# Patient Record
Sex: Female | Born: 1990 | Race: White | Hispanic: Yes | Marital: Single | Smoking: Former smoker
Health system: Southern US, Community
[De-identification: ages and names within clinical notes are randomized; demographics above are authoritative.]

## PROBLEM LIST (undated history)

## (undated) DIAGNOSIS — J45909 Unspecified asthma, uncomplicated: Secondary | ICD-10-CM

---
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
## 2009-05-17 ENCOUNTER — Emergency Department: Payer: Self-pay | Admitting: Internal Medicine

## 2009-05-24 ENCOUNTER — Emergency Department: Payer: Self-pay | Admitting: Emergency Medicine

## 2009-06-07 ENCOUNTER — Emergency Department: Payer: Self-pay | Admitting: Emergency Medicine

## 2009-06-09 ENCOUNTER — Emergency Department: Payer: Self-pay | Admitting: Emergency Medicine

## 2009-06-15 ENCOUNTER — Emergency Department: Payer: Self-pay | Admitting: Emergency Medicine

## 2010-06-04 ENCOUNTER — Emergency Department: Payer: Self-pay | Admitting: Unknown Physician Specialty

## 2011-05-05 ENCOUNTER — Emergency Department: Payer: Self-pay | Admitting: Emergency Medicine

## 2011-07-13 ENCOUNTER — Emergency Department: Payer: Self-pay | Admitting: Emergency Medicine

## 2016-10-20 ENCOUNTER — Encounter: Payer: Self-pay | Admitting: Emergency Medicine

## 2016-10-20 ENCOUNTER — Emergency Department
Admission: EM | Admit: 2016-10-20 | Discharge: 2016-10-20 | Disposition: A | Discharge status: Home / Self Care | Payer: Self-pay | Attending: Emergency Medicine | Admitting: Emergency Medicine

## 2016-10-20 DIAGNOSIS — N390 Urinary tract infection, site not specified: Secondary | ICD-10-CM | POA: Insufficient documentation

## 2016-10-20 DIAGNOSIS — F17200 Nicotine dependence, unspecified, uncomplicated: Secondary | ICD-10-CM | POA: Insufficient documentation

## 2016-10-20 DIAGNOSIS — J45909 Unspecified asthma, uncomplicated: Secondary | ICD-10-CM | POA: Insufficient documentation

## 2016-10-20 DIAGNOSIS — R42 Dizziness and giddiness: Secondary | ICD-10-CM | POA: Insufficient documentation

## 2016-10-20 HISTORY — DX: Unspecified asthma, uncomplicated: J45.909

## 2016-10-20 LAB — BASIC METABOLIC PANEL
ANION GAP: 9 (ref 5–15)
BUN: 13 mg/dL (ref 6–20)
CHLORIDE: 108 mmol/L (ref 101–111)
CO2: 22 mmol/L (ref 22–32)
Calcium: 8.9 mg/dL (ref 8.9–10.3)
Creatinine, Ser: 0.75 mg/dL (ref 0.44–1.00)
GFR calc Af Amer: >60 mL/min (ref >60)
GLUCOSE: 114 mg/dL — AB (ref 65–99)
POTASSIUM: 3.8 mmol/L (ref 3.5–5.1)
Sodium: 139 mmol/L (ref 135–145)

## 2016-10-20 LAB — URINALYSIS, COMPLETE (UACMP) WITH MICROSCOPIC
BILIRUBIN URINE: NEGATIVE
GLUCOSE, UA: NEGATIVE mg/dL
Ketones, ur: NEGATIVE mg/dL
NITRITE: NEGATIVE
PH: 5 (ref 5.0–8.0)
Protein, ur: 30 mg/dL — AB
SPECIFIC GRAVITY, URINE: 1.025 (ref 1.005–1.030)

## 2016-10-20 LAB — CBC
HCT: 41.8 % (ref 35.0–47.0)
Hemoglobin: 14.6 g/dL (ref 12.0–16.0)
MCH: 29.1 pg (ref 26.0–34.0)
MCHC: 35 g/dL (ref 32.0–36.0)
MCV: 83.2 fL (ref 80.0–100.0)
PLATELETS: 597 10*3/uL — AB (ref 150–440)
RBC: 5.03 MIL/uL (ref 3.80–5.20)
RDW: 13.7 % (ref 11.5–14.5)
WBC: 13.8 10*3/uL — ABNORMAL HIGH (ref 3.6–11.0)

## 2016-10-20 LAB — POCT PREGNANCY, URINE: Preg Test, Ur: NEGATIVE

## 2016-10-20 MED ORDER — CEPHALEXIN 500 MG PO CAPS: 500.0000 mg | ORAL_CAPSULE | Freq: Two times a day (BID) | ORAL | 0 refills | Status: AC

## 2016-10-20 MED ORDER — CEPHALEXIN 500 MG PO CAPS
500.0000 mg | ORAL_CAPSULE | Freq: Once | ORAL | Status: AC
  Administered 2016-10-20: 500 mg via ORAL
  Filled 2016-10-20: qty 1

## 2016-10-20 NOTE — ED Provider Notes (Signed)
[facility] Emergency Department Provider Note  Time seen: 9:10 PM  I have reviewed the triage vital signs and the nursing notes.   HISTORY  Chief Complaint Dizziness    HPI Grace Hanson is a 26 y.o. female with a past medical history of asthma presents to the emergency department for dizziness. According to the patient she awoke this morning she got up and felt somewhat dizzy. States she thought she might adjusted up to quickly however this afternoon she states she got up and once again felt dizzy so she came to the emergency department for evaluation. Patient denies any focal weakness or numbness. Denies nausea, vomiting, diarrhea. Denies chest pain or abdominal pain. Denies headache. Patient states currently she feels normal. No complaints at this time.  Past Medical History:  Diagnosis Date  . Asthma     There are no active problems to display for this patient.   History reviewed. No pertinent surgical history.  Prior to Admission medications   Not on File    No Known Allergies  No family history on file.  Social History Social History  Substance Use Topics  . Smoking status: Current Every Day Smoker    Packs/day: 0.50  . Smokeless tobacco: Never Used  . Alcohol use No    Review of Systems Constitutional: Negative for fever. Cardiovascular: Negative for chest pain. Respiratory: Negative for shortness of breath. Gastrointestinal: Negative for abdominal pain, vomiting and diarrhea. Genitourinary: Negative for dysuria.Negative for vaginal bleeding or discharge. Neurological: Negative for headaches, focal weakness or numbness. 10-point ROS otherwise negative.  ____________________________________________   PHYSICAL EXAM:  VITAL SIGNS: ED Triage Vitals  Enc Vitals Group     BP 10/20/16 2004 125/65     Pulse Rate 10/20/16 2004 75     Resp 10/20/16 2004 16     Temp 10/20/16 2004 98 F (36.7 C)     Temp Source 10/20/16 2004  Oral     SpO2 10/20/16 2004 98 %     Weight 10/20/16 2005 180 lb (81.6 kg)     Height 10/20/16 2005 5\' 3"  (1.6 m)     Head Circumference --      Peak Flow --      Pain Score 10/20/16 2006 0     Pain Loc --      Pain Edu? --      Excl. in GC? --     Constitutional: Alert and oriented. Well appearing and in no distress. Eyes: Normal exam ENT   Head: Normocephalic and atraumatic.   Mouth/Throat: Mucous membranes are moist. Cardiovascular: Normal rate, regular rhythm. No murmur Respiratory: Normal respiratory effort without tachypnea nor retractions. Breath sounds are clear Gastrointestinal: Soft and nontender. No distention. Musculoskeletal: Nontender with normal range of motion in all extremities. Neurologic:  Normal speech and language. No gross focal neurologic deficits Skin:  Skin is warm, dry and intact.  Psychiatric: Mood and affect are normal.   ____________________________________________    EKG  EKG reviewed and interpreted, so shows normal sinus rhythm at 74 bpm. Narrow QRS, normal axis, normal intervals, no concerning ST changes.  ____________________________________________    INITIAL IMPRESSION / ASSESSMENT AND PLAN / ED COURSE  Pertinent labs & imaging results that were available during my care of the patient were reviewed by me and considered in my medical decision making (see chart for details).  Patient presents to the emergency department with dizziness starting this morning. Patient's labs show an elevated white blood cell count  of 13,000 with thrombocytosis. Patient's urinalysis shows too numerous to count white blood cells with a large amount of leukocytes. Patient denies vaginal bleeding or discharge. Highly suspect urinary tract infection causing the patient's dizziness. Overall the patient has a very normal physical examination, denies any dizziness or any other complaint at this time. We will send a urine culture, prescribe  antibiotic.  ____________________________________________   FINAL CLINICAL IMPRESSION(S) / ED DIAGNOSES  Dizziness Urinary tract infection    Minna Antis, MD 10/20/16 2113

## 2016-10-20 NOTE — ED Triage Notes (Signed)
Pt states that she felt dizzy this morning x2. Pt states that she "feels like she has no pressure in her nose" and she has asthma. Pt denies LOC and is ambulatory to triage and steady on her feet.

## 2016-10-20 NOTE — Discharge Instructions (Signed)
Please take her antibiotic as prescribed. Please drink plenty of fluids. Return to the emergency department for any worsening dizziness, or any other symptom personally concerning to yourself.

## 2016-10-22 LAB — URINE CULTURE

## 2018-09-12 ENCOUNTER — Emergency Department: Payer: Self-pay

## 2018-09-12 ENCOUNTER — Encounter: Payer: Self-pay | Admitting: Emergency Medicine

## 2018-09-12 ENCOUNTER — Emergency Department
Admission: EM | Admit: 2018-09-12 | Discharge: 2018-09-12 | Disposition: A | Discharge status: Home / Self Care | Payer: Self-pay | Attending: Emergency Medicine | Admitting: Emergency Medicine

## 2018-09-12 ENCOUNTER — Other Ambulatory Visit: Payer: Self-pay

## 2018-09-12 DIAGNOSIS — F17200 Nicotine dependence, unspecified, uncomplicated: Secondary | ICD-10-CM | POA: Insufficient documentation

## 2018-09-12 DIAGNOSIS — J45909 Unspecified asthma, uncomplicated: Secondary | ICD-10-CM | POA: Insufficient documentation

## 2018-09-12 DIAGNOSIS — M5410 Radiculopathy, site unspecified: Secondary | ICD-10-CM | POA: Insufficient documentation

## 2018-09-12 LAB — POCT PREGNANCY, URINE: PREG TEST UR: NEGATIVE

## 2018-09-12 MED ORDER — DEXAMETHASONE SODIUM PHOSPHATE 10 MG/ML IJ SOLN
10.0000 mg | Freq: Once | INTRAMUSCULAR | Status: AC
  Administered 2018-09-12: 10 mg via INTRAMUSCULAR
  Filled 2018-09-12: qty 1

## 2018-09-12 MED ORDER — PREDNISONE 50 MG PO TABS: ORAL_TABLET | ORAL | 0 refills | Status: DC

## 2018-09-12 NOTE — ED Provider Notes (Signed)
[facility] Emergency Department Provider Note  ____________________________________________  Time seen: Approximately 9:49 PM  I have reviewed the triage vital signs and the nursing notes.   HISTORY  Chief Complaint Leg Pain    HPI Grace Hanson is a 27 y.o. female presents to the emergency department with 6 out of 10 left lower leg pain and left lateral hip pain after patient reports that she fell approximately 3 weeks ago in the shower.  Patient reports that she did not have pain initially but several days after the injury she started to have discomfort while sitting in the car.  Patient describes pain as "like numb and tingling".  Pain does seem to radiate along calf and lateral hip.  Patient denies bowel or bladder incontinence or saddle anesthesia.  Patient reports that pain is improved with ambulation and worsened with a sitting position.  Patient denies fever.  She has been taking ibuprofen at home.  She denies contraceptive use, recent travel, prolonged immobility, shortness of breath, pleuritic chest pain or prior history of DVT or PE.   Past Medical History:  Diagnosis Date  . Asthma     There are no active problems to display for this patient.   History reviewed. No pertinent surgical history.  Prior to Admission medications   Medication Sig Start Date End Date Taking? Authorizing Provider  predniSONE (DELTASONE) 50 MG tablet Take 6 tablets the first day, take 5 tablets the second day, take 4 tablets the third day, take 3 tablets the fourth day, take 2 tablets the fifth day, take 1 tablet the sixth day. 09/12/18   Orvil Feil, PA-C    Allergies Patient has no known allergies.  No family history on file.  Social History Social History   Tobacco Use  . Smoking status: Current Every Day Smoker    Packs/day: 0.50  . Smokeless tobacco: Never Used  Substance Use Topics  . Alcohol use: No  . Drug use: No     Review of Systems   Constitutional: No fever/chills Eyes: No visual changes. No discharge ENT: No upper respiratory complaints. Cardiovascular: no chest pain. Respiratory: no cough. No SOB. Gastrointestinal: No abdominal pain.  No nausea, no vomiting.  No diarrhea.  No constipation. Musculoskeletal: Patient has left lower leg pain.  Skin: Negative for rash, abrasions, lacerations, ecchymosis. Neurological: Negative for headaches, focal weakness or numbness. ______________________________________   PHYSICAL EXAM:  VITAL SIGNS: ED Triage Vitals  Enc Vitals Group     BP 09/12/18 1909 130/80     Pulse Rate 09/12/18 1909 89     Resp 09/12/18 1909 18     Temp 09/12/18 1909 98.5 F (36.9 C)     Temp Source 09/12/18 1909 Oral     SpO2 09/12/18 1909 99 %     Weight 09/12/18 1910 180 lb (81.6 kg)     Height 09/12/18 1910 5\' 3"  (1.6 m)     Head Circumference --      Peak Flow --      Pain Score 09/12/18 1909 8     Pain Loc --      Pain Edu? --      Excl. in GC? --      Constitutional: Alert and oriented. Well appearing and in no acute distress. Eyes: Conjunctivae are normal. PERRL. EOMI. Head: Atraumatic. Cardiovascular: Normal rate, regular rhythm. Normal S1 and S2.  Good peripheral circulation. Respiratory: Normal respiratory effort without tachypnea or retractions. Lungs CTAB. Good air entry  to the bases with no decreased or absent breath sounds. Musculoskeletal: Full range of motion to all extremities. No gross deformities appreciated. Neurologic:  Normal speech and language. No gross focal neurologic deficits are appreciated.  Skin: No erythema of the skin overlying the left calf. Psychiatric: Mood and affect are normal. Speech and behavior are normal. Patient exhibits appropriate insight and judgement.   ____________________________________________   LABS (all labs ordered are listed, but only abnormal results are displayed)  Labs Reviewed  POCT PREGNANCY, URINE    ____________________________________________  EKG   ____________________________________________  RADIOLOGY I personally viewed and evaluated these images as part of my medical decision making, as well as reviewing the written report by the radiologist.  Dg Lumbar Spine 2-3 Views  Result Date: 09/12/2018 CLINICAL DATA:  Fall 3 weeks ago.  Radicular pain in left leg EXAM: LUMBAR SPINE - 2-3 VIEW COMPARISON:  None. FINDINGS: Transitional anatomy at the lumbosacral junction. Normal alignment. No fracture. SI joints are symmetric and unremarkable. IMPRESSION: Negative. Electronically Signed   By: Charlett NoseKevin  Dover M.D.   On: 09/12/2018 21:00   Dg Tibia/fibula Left  Result Date: 09/12/2018 CLINICAL DATA:  Fall 3 weeks ago after a slip in the shower. Subsequent leg pain. EXAM: LEFT TIBIA AND FIBULA - 2 VIEW COMPARISON:  None. FINDINGS: Achilles calcaneal spur. Mild spurring along the proximal dorsal margin of the navicular. No fracture or acute bony abnormality is identified. IMPRESSION: 1. No acute findings. 2. Minimal degenerative spurring in the hindfoot/midfoot. Electronically Signed   By: Gaylyn RongWalter  Liebkemann M.D.   On: 09/12/2018 21:16    ____________________________________________    PROCEDURES  Procedure(s) performed:    Procedures    Medications  dexamethasone (DECADRON) injection 10 mg (10 mg Intramuscular Given 09/12/18 2134)     ____________________________________________   INITIAL IMPRESSION / ASSESSMENT AND PLAN / ED COURSE  Pertinent labs & imaging results that were available during my care of the patient were reviewed by me and considered in my medical decision making (see chart for details).  Review of the  [place] was performed in accordance of the [place] prior to dispensing any controlled drugs.    Assessment and plan Radicular pain Patient presents to the emergency department with numbness and tingling of the left lower extremity from the left hip to the  left calf.  History and physical exam findings are consistent with referred pain from the lumbar spine.  X-ray examination of the lumbar spine and the left tibia/fibula revealed no acute bony abnormality.  Patient was given an injection of Decadron in the emergency department and was discharged with prednisone.  Vital signs are reassuring prior to discharge.  All patient questions were answered.   ____________________________________________  FINAL CLINICAL IMPRESSION(S) / ED DIAGNOSES  Final diagnoses:  Radicular pain      NEW MEDICATIONS STARTED DURING THIS VISIT:  ED Discharge Orders         Ordered    predniSONE (DELTASONE) 50 MG tablet     09/12/18 2127              This chart was dictated using voice recognition software/Dragon. Despite best efforts to proofread, errors can occur which can change the meaning. Any change was purely unintentional.    Gasper LloydWoods, Kaelee Pfeffer M, PA-C 09/12/18 2157    Myrna BlazerSchaevitz, David Matthew, MD 09/13/18 [phone]

## 2018-09-12 NOTE — ED Triage Notes (Signed)
Pt arrives ambulatory to triage with c/o fall x 3 weeks ago and subsequent left leg pain at this time. Pt is in NAD.

## 2019-07-06 ENCOUNTER — Other Ambulatory Visit: Payer: Self-pay

## 2019-07-06 ENCOUNTER — Emergency Department
Admission: EM | Admit: 2019-07-06 | Discharge: 2019-07-06 | Disposition: A | Discharge status: Home / Self Care | Payer: Self-pay | Attending: Student in an Organized Health Care Education/Training Program | Admitting: Student in an Organized Health Care Education/Training Program

## 2019-07-06 ENCOUNTER — Emergency Department: Payer: Self-pay

## 2019-07-06 DIAGNOSIS — Z87891 Personal history of nicotine dependence: Secondary | ICD-10-CM | POA: Insufficient documentation

## 2019-07-06 DIAGNOSIS — J45909 Unspecified asthma, uncomplicated: Secondary | ICD-10-CM | POA: Insufficient documentation

## 2019-07-06 DIAGNOSIS — R1030 Lower abdominal pain, unspecified: Secondary | ICD-10-CM

## 2019-07-06 DIAGNOSIS — N760 Acute vaginitis: Secondary | ICD-10-CM | POA: Insufficient documentation

## 2019-07-06 DIAGNOSIS — B9689 Other specified bacterial agents as the cause of diseases classified elsewhere: Secondary | ICD-10-CM | POA: Insufficient documentation

## 2019-07-06 LAB — COMPREHENSIVE METABOLIC PANEL
ALT: 40 U/L (ref 0–44)
AST: 30 U/L (ref 15–41)
Albumin: 4.1 g/dL (ref 3.5–5.0)
Alkaline Phosphatase: 60 U/L (ref 38–126)
Anion gap: 10 (ref 5–15)
BUN: 10 mg/dL (ref 6–20)
CO2: 22 mmol/L (ref 22–32)
Calcium: 9.3 mg/dL (ref 8.9–10.3)
Chloride: 104 mmol/L (ref 98–111)
Creatinine, Ser: 0.51 mg/dL (ref 0.44–1.00)
GFR calc Af Amer: >60 mL/min (ref >60)
GFR calc non Af Amer: >60 mL/min (ref >60)
Glucose, Bld: 97 mg/dL (ref 70–99)
Potassium: 3.9 mmol/L (ref 3.5–5.1)
Sodium: 136 mmol/L (ref 135–145)
Total Bilirubin: 0.7 mg/dL (ref 0.3–1.2)
Total Protein: 8 g/dL (ref 6.5–8.1)

## 2019-07-06 LAB — URINALYSIS, COMPLETE (UACMP) WITH MICROSCOPIC
Bilirubin Urine: NEGATIVE
Glucose, UA: NEGATIVE mg/dL
Ketones, ur: NEGATIVE mg/dL
Nitrite: NEGATIVE
Protein, ur: NEGATIVE mg/dL
Specific Gravity, Urine: 1.025 (ref 1.005–1.030)
pH: 5 (ref 5.0–8.0)

## 2019-07-06 LAB — CBC
HCT: 42.3 % (ref 36.0–46.0)
Hemoglobin: 14.5 g/dL (ref 12.0–15.0)
MCH: 28.6 pg (ref 26.0–34.0)
MCHC: 34.3 g/dL (ref 30.0–36.0)
MCV: 83.4 fL (ref 80.0–100.0)
Platelets: 565 10*3/uL — ABNORMAL HIGH (ref 150–400)
RBC: 5.07 MIL/uL (ref 3.87–5.11)
RDW: 13.3 % (ref 11.5–15.5)
WBC: 16.2 10*3/uL — ABNORMAL HIGH (ref 4.0–10.5)
nRBC: 0 % (ref 0.0–0.2)

## 2019-07-06 LAB — WET PREP, GENITAL
Sperm: NONE SEEN
Trich, Wet Prep: NONE SEEN
Yeast Wet Prep HPF POC: NONE SEEN

## 2019-07-06 LAB — POCT PREGNANCY, URINE: Preg Test, Ur: NEGATIVE

## 2019-07-06 LAB — LIPASE, BLOOD: Lipase: 28 U/L (ref 11–51)

## 2019-07-06 MED ORDER — SODIUM CHLORIDE 0.9% FLUSH: 3.0000 mL | Freq: Once | INTRAVENOUS | Status: DC

## 2019-07-06 MED ORDER — IOHEXOL 300 MG/ML  SOLN
100.0000 mL | Freq: Once | INTRAMUSCULAR | Status: AC | PRN
  Administered 2019-07-06: 100 mL via INTRAVENOUS
  Filled 2019-07-06: qty 100

## 2019-07-06 MED ORDER — METRONIDAZOLE 500 MG PO TABS: 500.0000 mg | ORAL_TABLET | Freq: Three times a day (TID) | ORAL | 0 refills | Status: AC

## 2019-07-06 MED ORDER — ONDANSETRON HCL 4 MG PO TABS: 4.0000 mg | ORAL_TABLET | Freq: Every day | ORAL | 0 refills | Status: DC | PRN

## 2019-07-06 MED ORDER — METRONIDAZOLE 500 MG PO TABS
500.0000 mg | ORAL_TABLET | Freq: Once | ORAL | Status: AC
  Administered 2019-07-06: 500 mg via ORAL
  Filled 2019-07-06: qty 1

## 2019-07-06 NOTE — ED Provider Notes (Signed)
[facility] Emergency Department Provider Note    First MD Initiated Contact with Patient 07/06/19 1855     (approximate)  I have reviewed the triage vital signs and the nursing notes.   HISTORY  Chief Complaint Abdominal Pain    HPI Grace Hanson is a 28 y.o. female presents the ER for evaluation of suprapubic and low back pain started last night is progressively worsened.  Denies any diarrhea or dysuria.  Has had some chills but no measured fevers.  No nausea or vomiting.  No previous surgeries.  States that she has had some vaginal spotting.  Denies any history of STI.  Has not taken anything for pain.  States is mild to moderate.    Past Medical History:  Diagnosis Date  . Asthma    No family history on file. History reviewed. No pertinent surgical history. There are no active problems to display for this patient.     Prior to Admission medications   Medication Sig Start Date End Date Taking? Authorizing Provider  metroNIDAZOLE (FLAGYL) 500 MG tablet Take 1 tablet (500 mg total) by mouth 3 (three) times daily for 7 days. 07/06/19 07/13/19  Merlyn Lot, MD  ondansetron (ZOFRAN) 4 MG tablet Take 1 tablet (4 mg total) by mouth daily as needed. 07/06/19 07/05/20  Merlyn Lot, MD  predniSONE (DELTASONE) 50 MG tablet Take 6 tablets the first day, take 5 tablets the second day, take 4 tablets the third day, take 3 tablets the fourth day, take 2 tablets the fifth day, take 1 tablet the sixth day. 09/12/18   Lannie Fields, PA-C    Allergies Patient has no known allergies.    Social History Social History   Tobacco Use  . Smoking status: Former Smoker    Packs/day: 0.50  . Smokeless tobacco: Never Used  Substance Use Topics  . Alcohol use: No  . Drug use: No    Review of Systems Patient denies headaches, rhinorrhea, blurry vision, numbness, shortness of breath, chest pain, edema, cough, abdominal pain, nausea, vomiting,  diarrhea, dysuria, fevers, rashes or hallucinations unless otherwise stated above in HPI. ____________________________________________   PHYSICAL EXAM:  VITAL SIGNS: Vitals:   07/06/19 1823  BP: 127/73  Pulse: 81  Resp: 16  Temp: 97.9 F (36.6 C)  SpO2: 97%    Constitutional: Alert and oriented.  Eyes: Conjunctivae are normal.  Head: Atraumatic. Nose: No congestion/rhinnorhea. Mouth/Throat: Mucous membranes are moist.   Neck: No stridor. Painless ROM.  Cardiovascular: Normal rate, regular rhythm. Grossly normal heart sounds.  Good peripheral circulation. Respiratory: Normal respiratory effort.  No retractions. Lungs CTAB. Gastrointestinal: Soft with mild ttp in RLE and suprapubic region. No distention. No abdominal bruits. No CVA tenderness. Genitourinary: Normal-appearing vaginal mucosa without any evidence of hemorrhage or mass. Musculoskeletal: No lower extremity tenderness nor edema.  No joint effusions. Neurologic:  Normal speech and language. No gross focal neurologic deficits are appreciated. No facial droop Skin:  Skin is warm, dry and intact. No rash noted. Psychiatric: Mood and affect are normal. Speech and behavior are normal.  ____________________________________________   LABS (all labs ordered are listed, but only abnormal results are displayed)  Results for orders placed or performed during the hospital encounter of 07/06/19 (from the past 24 hour(s))  Lipase, blood     Status: None   Collection Time: 07/06/19  6:27 PM  Result Value Ref Range   Lipase 28 11 - 51 U/L  Comprehensive metabolic panel  Status: None   Collection Time: 07/06/19  6:27 PM  Result Value Ref Range   Sodium 136 135 - 145 mmol/L   Potassium 3.9 3.5 - 5.1 mmol/L   Chloride 104 98 - 111 mmol/L   CO2 22 22 - 32 mmol/L   Glucose, Bld 97 70 - 99 mg/dL   BUN 10 6 - 20 mg/dL   Creatinine, Ser 4.090.51 0.44 - 1.00 mg/dL   Calcium 9.3 8.9 - 81.110.3 mg/dL   Total Protein 8.0 6.5 - 8.1 g/dL    Albumin 4.1 3.5 - 5.0 g/dL   AST 30 15 - 41 U/L   ALT 40 0 - 44 U/L   Alkaline Phosphatase 60 38 - 126 U/L   Total Bilirubin 0.7 0.3 - 1.2 mg/dL   GFR calc non Af Amer >60 >60 mL/min   GFR calc Af Amer >60 >60 mL/min   Anion gap 10 5 - 15  CBC     Status: Abnormal   Collection Time: 07/06/19  6:27 PM  Result Value Ref Range   WBC 16.2 (H) 4.0 - 10.5 K/uL   RBC 5.07 3.87 - 5.11 MIL/uL   Hemoglobin 14.5 12.0 - 15.0 g/dL   HCT 91.442.3 78.236.0 - 95.646.0 %   MCV 83.4 80.0 - 100.0 fL   MCH 28.6 26.0 - 34.0 pg   MCHC 34.3 30.0 - 36.0 g/dL   RDW 21.313.3 08.611.5 - 57.815.5 %   Platelets 565 (H) 150 - 400 K/uL   nRBC 0.0 0.0 - 0.2 %  Urinalysis, Complete w Microscopic     Status: Abnormal   Collection Time: 07/06/19  6:27 PM  Result Value Ref Range   Color, Urine YELLOW (A) YELLOW   APPearance HAZY (A) CLEAR   Specific Gravity, Urine 1.025 1.005 - 1.030   pH 5.0 5.0 - 8.0   Glucose, UA NEGATIVE NEGATIVE mg/dL   Hgb urine dipstick MODERATE (A) NEGATIVE   Bilirubin Urine NEGATIVE NEGATIVE   Ketones, ur NEGATIVE NEGATIVE mg/dL   Protein, ur NEGATIVE NEGATIVE mg/dL   Nitrite NEGATIVE NEGATIVE   Leukocytes,Ua TRACE (A) NEGATIVE   RBC / HPF 0-5 0 - 5 RBC/hpf   WBC, UA 6-10 0 - 5 WBC/hpf   Bacteria, UA RARE (A) NONE SEEN   Squamous Epithelial / LPF 6-10 0 - 5   Mucus PRESENT   Pregnancy, urine POC     Status: None   Collection Time: 07/06/19  6:35 PM  Result Value Ref Range   Preg Test, Ur NEGATIVE NEGATIVE  Wet prep, genital     Status: Abnormal   Collection Time: 07/06/19  8:46 PM   Specimen: Vaginal  Result Value Ref Range   Yeast Wet Prep HPF POC NONE SEEN NONE SEEN   Trich, Wet Prep NONE SEEN NONE SEEN   Clue Cells Wet Prep HPF POC PRESENT (A) NONE SEEN   WBC, Wet Prep HPF POC FEW (A) NONE SEEN   Sperm NONE SEEN    ____________________________________________   ____________________________________________  RADIOLOGY  I personally reviewed all radiographic images ordered to evaluate for  the above acute complaints and reviewed radiology reports and findings.  These findings were personally discussed with the patient.  Please see medical record for radiology report.  ____________________________________________   PROCEDURES  Procedure(s) performed:  Procedures    Critical Care performed: no ____________________________________________   INITIAL IMPRESSION / ASSESSMENT AND PLAN / ED COURSE  Pertinent labs & imaging results that were available during my care of the  patient were reviewed by me and considered in my medical decision making (see chart for details).   DDX: Appendicitis, TOA, colitis, cystitis, PID, BV, candidiasis, pregnancy, torsion  [place] is a 28 y.o. who presents to the ED with symptoms as described above.  Patient currently afebrile does have some mild suprapubic lower abdominal tenderness with exam somewhat limited due to obesity.  Does have elevated leukocytosis therefore will order CT imaging to exclude appendicitis or abscess.  Given duration and characteristics of pain have a lower suspicion for pathology such as ovarian cyst or torsion.  Clinical Course as of Jul 05 2121  Tue Jul 06, 2019  2106 Pelvic exam without any significant discharge.  No cervical motion tenderness.  No adnexal tenderness.  Does not seem consistent with PID.  Has not had intercourse in several months.  Uncertain etiology of her discomfort.  Has mild leukocytosis but also seems to always carry leukocytosis.  Given reassuring work-up I do believe she will be appropriate for outpatient follow-up.   [PR]    Clinical Course User Index [PR] Willy Eddy, MD    The patient was evaluated in Emergency Department today for the symptoms described in the history of present illness. He/she was evaluated in the context of the global COVID-19 pandemic, which necessitated consideration that the patient might be at risk for infection with the SARS-CoV-2 virus that causes  COVID-19. Institutional protocols and algorithms that pertain to the evaluation of patients at risk for COVID-19 are in a state of rapid change based on information released by regulatory bodies including the CDC and federal and state organizations. These policies and algorithms were followed during the patient's care in the ED.  As part of my medical decision making, I reviewed the following data within the electronic MEDICAL RECORD NUMBER Nursing notes reviewed and incorporated, Labs reviewed, notes from prior ED visits and [place] Controlled Substance Database   ____________________________________________   FINAL CLINICAL IMPRESSION(S) / ED DIAGNOSES  Final diagnoses:  Lower abdominal pain  Bacterial vaginosis      NEW MEDICATIONS STARTED DURING THIS VISIT:  New Prescriptions   METRONIDAZOLE (FLAGYL) 500 MG TABLET    Take 1 tablet (500 mg total) by mouth 3 (three) times daily for 7 days.   ONDANSETRON (ZOFRAN) 4 MG TABLET    Take 1 tablet (4 mg total) by mouth daily as needed.     Note:  This document was prepared using Dragon voice recognition software and may include unintentional dictation errors.    Willy Eddy, MD 07/06/19 2122

## 2019-07-06 NOTE — Discharge Instructions (Signed)

## 2019-07-06 NOTE — ED Triage Notes (Signed)
Pt to the er for abd pain and back pain. No pain with urination. Pt has stated she has had some intermittent vaginal spotting.

## 2019-11-09 ENCOUNTER — Ambulatory Visit (INDEPENDENT_AMBULATORY_CARE_PROVIDER_SITE_OTHER): Payer: PRIVATE HEALTH INSURANCE | Admitting: Obstetrics and Gynecology

## 2019-11-09 ENCOUNTER — Encounter: Payer: Self-pay | Admitting: Obstetrics and Gynecology

## 2019-11-09 ENCOUNTER — Other Ambulatory Visit: Payer: Self-pay

## 2019-11-09 ENCOUNTER — Other Ambulatory Visit (HOSPITAL_COMMUNITY)
Admission: RE | Admit: 2019-11-09 | Discharge: 2019-11-09 | Disposition: A | Discharge status: Home / Self Care | Payer: Self-pay | Source: Ambulatory Visit | Attending: Obstetrics and Gynecology | Admitting: Obstetrics and Gynecology

## 2019-11-09 VITALS — BP 100/76 | Ht 62.0 in | Wt 192.0 lb

## 2019-11-09 DIAGNOSIS — Z124 Encounter for screening for malignant neoplasm of cervix: Secondary | ICD-10-CM

## 2019-11-09 DIAGNOSIS — Z01419 Encounter for gynecological examination (general) (routine) without abnormal findings: Secondary | ICD-10-CM

## 2019-11-09 DIAGNOSIS — Z1151 Encounter for screening for human papillomavirus (HPV): Secondary | ICD-10-CM

## 2019-11-09 DIAGNOSIS — R1031 Right lower quadrant pain: Secondary | ICD-10-CM | POA: Insufficient documentation

## 2019-11-09 DIAGNOSIS — R319 Hematuria, unspecified: Secondary | ICD-10-CM | POA: Diagnosis not present

## 2019-11-09 DIAGNOSIS — R399 Unspecified symptoms and signs involving the genitourinary system: Secondary | ICD-10-CM

## 2019-11-09 DIAGNOSIS — R300 Dysuria: Secondary | ICD-10-CM | POA: Diagnosis not present

## 2019-11-09 LAB — POCT URINALYSIS DIPSTICK
Bilirubin, UA: NEGATIVE
Glucose, UA: NEGATIVE
Ketones, UA: NEGATIVE
Leukocytes, UA: NEGATIVE
Nitrite, UA: NEGATIVE
Protein, UA: NEGATIVE
Spec Grav, UA: 1.025 (ref 1.010–1.025)
pH, UA: 5 (ref 5.0–8.0)

## 2019-11-09 MED ORDER — NITROFURANTOIN MONOHYD MACRO 100 MG PO CAPS: 100.0000 mg | ORAL_CAPSULE | Freq: Two times a day (BID) | ORAL | 0 refills | Status: AC

## 2019-11-09 NOTE — Progress Notes (Addendum)
PCP:  Patient, No Pcp Per   Chief Complaint  Patient presents with  . Gynecologic Exam  . Urinary Tract Infection    frequency urinating, blood when wipes, no burning sensation when urinating x on/off for a few weeks     HPI:      Grace Hanson is a 29 y.o. No obstetric history on file. who LMP was Patient's last menstrual period was 10/26/2019 (approximate)., presents today for her NP annual examination.  Her menses are regular every 28-30 days, lasting 4-5 days.  Dysmenorrhea mild. She does not have intermenstrual bleeding. Pt has had random RLQ stabbing pains over the past 2 yrs. Sx don't last long but were worse 10/20 and went to ED. Had neg CT scan, neg STD testing, and diagnosed with BV and treated (wasn't having any sx). Had hematuria on UA, no C&S done.   Pt complains of urinary frequency with decreased flow, dysuria, hematuria with wiping, no LBP. Has had UTIs in past few yrs. No hx of kidney stones.  Sex activity: single partner, contraception - condoms , declines other BC. No dyspareunia.  Last Pap: not recent; no hx of abn Hx of STDs: none, neg testing 10/20.  There is no FH of breast cancer. There is no FH of ovarian cancer. The patient does not do self-breast exams.  Tobacco use: quit 2 yrs ago Alcohol use: none No drug use.  Exercise: moderately active  She does get adequate calcium but not Vitamin D in her diet.  Past Medical History:  Diagnosis Date  . Asthma     History reviewed. No pertinent surgical history.  Family History  Problem Relation Age of Onset  . Diabetes Mother   . Diabetes Sister     Social History   Socioeconomic History  . Marital status: Single    Spouse name: Not on file  . Number of children: Not on file  . Years of education: Not on file  . Highest education level: Not on file  Occupational History  . Not on file  Tobacco Use  . Smoking status: Former Smoker    Packs/day: 0.50  . Smokeless tobacco: Never Used   Substance and Sexual Activity  . Alcohol use: No  . Drug use: No  . Sexual activity: Yes    Birth control/protection: None, Condom  Other Topics Concern  . Not on file  Social History Narrative  . Not on file   Social Determinants of Health   Financial Resource Strain:   . Difficulty of Paying Living Expenses: Not on file  Food Insecurity:   . Worried About Charity fundraiser in the Last Year: Not on file  . Ran Out of Food in the Last Year: Not on file  Transportation Needs:   . Lack of Transportation (Medical): Not on file  . Lack of Transportation (Non-Medical): Not on file  Physical Activity:   . Days of Exercise per Week: Not on file  . Minutes of Exercise per Session: Not on file  Stress:   . Feeling of Stress : Not on file  Social Connections:   . Frequency of Communication with Friends and Family: Not on file  . Frequency of Social Gatherings with Friends and Family: Not on file  . Attends Religious Services: Not on file  . Active Member of Clubs or Organizations: Not on file  . Attends Archivist Meetings: Not on file  . Marital Status: Not on file  Intimate Partner  Violence:   . Fear of Current or Ex-Partner: Not on file  . Emotionally Abused: Not on file  . Physically Abused: Not on file  . Sexually Abused: Not on file     Current Outpatient Medications:  .  nitrofurantoin, macrocrystal-monohydrate, (MACROBID) 100 MG capsule, Take 1 capsule (100 mg total) by mouth 2 (two) times daily for 5 days., Disp: 10 capsule, Rfl: 0     ROS:  Review of Systems  Constitutional: Negative for fatigue, fever and unexpected weight change.  Respiratory: Negative for cough, shortness of breath and wheezing.   Cardiovascular: Negative for chest pain, palpitations and leg swelling.  Gastrointestinal: Negative for blood in stool, constipation, diarrhea, nausea and vomiting.  Endocrine: Negative for cold intolerance, heat intolerance and polyuria.    Genitourinary: Positive for dysuria, hematuria and pelvic pain. Negative for dyspareunia, flank pain, frequency, genital sores, menstrual problem, urgency, vaginal bleeding, vaginal discharge and vaginal pain.  Musculoskeletal: Negative for back pain, joint swelling and myalgias.  Skin: Negative for rash.  Neurological: Negative for dizziness, syncope, light-headedness, numbness and headaches.  Hematological: Negative for adenopathy.  Psychiatric/Behavioral: Negative for agitation, confusion, sleep disturbance and suicidal ideas. The patient is not nervous/anxious.   BREAST: No symptoms   Objective: BP 100/76   Ht 5\' 2"  (1.575 m)   Wt 192 lb (87.1 kg)   LMP 10/26/2019 (Approximate)   BMI 35.12 kg/m    Physical Exam Constitutional:      Appearance: She is well-developed.  Genitourinary:     Vulva, vagina, cervix, uterus, right adnexa and left adnexa normal.     No vulval lesion or tenderness noted.     No vaginal discharge, erythema or tenderness.     No cervical polyp.     Uterus is not enlarged or tender.     No right or left adnexal mass present.     Right adnexa not tender.     Left adnexa not tender.  Neck:     Thyroid: No thyromegaly.  Cardiovascular:     Rate and Rhythm: Normal rate and regular rhythm.     Heart sounds: Normal heart sounds. No murmur.  Pulmonary:     Effort: Pulmonary effort is normal.     Breath sounds: Normal breath sounds.  Chest:     Breasts:        Right: No mass, nipple discharge, skin change or tenderness.        Left: No mass, nipple discharge, skin change or tenderness.  Abdominal:     Palpations: Abdomen is soft.     Tenderness: There is no abdominal tenderness. There is no guarding or rebound.  Musculoskeletal:        General: Normal range of motion.     Cervical back: Normal range of motion.  Neurological:     General: No focal deficit present.     Mental Status: She is alert and oriented to person, place, and time.     Cranial  Nerves: No cranial nerve deficit.  Skin:    General: Skin is warm and dry.  Psychiatric:        Mood and Affect: Mood normal.        Behavior: Behavior normal.        Thought Content: Thought content normal.        Judgment: Judgment normal.  Vitals reviewed.     Results: Results for orders placed or performed in visit on 11/09/19 (from the past 24 hour(s))  POCT Urinalysis Dipstick  Status: Abnormal   Collection Time: 11/09/19  2:14 PM  Result Value Ref Range   Color, UA yellow    Clarity, UA cloudy    Glucose, UA Negative Negative   Bilirubin, UA neg    Ketones, UA neg    Spec Grav, UA 1.025 1.010 - 1.025   Blood, UA large    pH, UA 5.0 5.0 - 8.0   Protein, UA Negative Negative   Urobilinogen, UA     Nitrite, UA neg    Leukocytes, UA Negative Negative   Appearance     Odor      Assessment/Plan: Encounter for annual routine gynecological examination  Cervical cancer screening  Screening for HPV (human papillomavirus)  UTI symptoms - Plan: POCT Urinalysis Dipstick, Urine Culture, nitrofurantoin, macrocrystal-monohydrate, (MACROBID) 100 MG capsule; Pos sx and UA. Rx macrobid. Check C&S. Will f/u if neg. Hx of hematuria on UA.   RLQ abdominal pain--neg CT scan 10/20, neg exam today. Suggested pt see if correlates with ovulation. May also be MSK. F/u prn.   Meds ordered this encounter  Medications  . nitrofurantoin, macrocrystal-monohydrate, (MACROBID) 100 MG capsule    Sig: Take 1 capsule (100 mg total) by mouth 2 (two) times daily for 5 days.    Dispense:  10 capsule    Refill:  0    Order Specific Question:   Supervising Provider    Answer:   Nadara Mustard [[id number]]             GYN counsel adequate intake of calcium and vitamin D, diet and exercise     F/U  Return in about 1 year (around 11/08/2020).  Darolyn Double B. Lachrisha Ziebarth, PA-C 11/09/2019 2:19 PM

## 2019-11-09 NOTE — Patient Instructions (Signed)
I value your feedback and entrusting us with your care. If you get a [facility] patient survey, I would appreciate you taking the time to let us know about your experience today. Thank you!  As of September 09, 2019, your lab results will be released to your MyChart immediately, before I even have a chance to see them. Please give me time to review them and contact you if there are any abnormalities. Thank you for your patience.  

## 2019-11-09 NOTE — Addendum Note (Signed)
Addended by: Althea Grimmer B on: 11/09/2019 04:02 PM   Modules accepted: Orders

## 2019-11-11 LAB — CYTOLOGY - PAP
Comment: NEGATIVE
Diagnosis: NEGATIVE
High risk HPV: NEGATIVE

## 2019-11-11 LAB — URINE CULTURE

## 2019-11-17 LAB — GC/CHLAMYDIA PROBE AMP
Chlamydia trachomatis, NAA: NEGATIVE
Neisseria Gonorrhoeae by PCR: NEGATIVE

## 2020-01-04 ENCOUNTER — Telehealth: Payer: Self-pay

## 2020-01-04 DIAGNOSIS — R310 Gross hematuria: Secondary | ICD-10-CM

## 2020-01-04 NOTE — Telephone Encounter (Signed)
Pt is still having the bleeding  issue. Pt not sure if she needs to make another appointment or can you call her, please advise

## 2020-01-05 NOTE — Telephone Encounter (Signed)
Pls get more info on sx (I don't have any bleeding issues documented in my note from 2/21). Thx

## 2020-01-06 NOTE — Telephone Encounter (Signed)
Pt is talking about UTI sx. She is still having blood in urine, pelvic pain in center and right side at times (not bad), no pain while urinating. Finished antibiotic and is still having sx. Please advise.

## 2020-01-06 NOTE — Telephone Encounter (Signed)
Patient is returning missed call. Please advise 

## 2020-01-06 NOTE — Telephone Encounter (Signed)
Called pt, no answer, LVMTRC. 

## 2020-01-07 NOTE — Telephone Encounter (Signed)
Called pt, no answer, LVMTRC. 

## 2020-01-07 NOTE — Telephone Encounter (Signed)
Pt needs ref to urology given persistent sx and neg C&S; failed empiric abx.  I placed ref, she should be hearing back soon about appt info. Had neg CT scan 10/20. Thx

## 2020-01-10 ENCOUNTER — Encounter: Payer: Self-pay | Admitting: Obstetrics and Gynecology

## 2020-01-10 NOTE — Telephone Encounter (Signed)
Again, no luck, LVMTRC.

## 2020-01-11 ENCOUNTER — Ambulatory Visit: Payer: PRIVATE HEALTH INSURANCE | Admitting: Obstetrics and Gynecology

## 2020-01-31 NOTE — Progress Notes (Signed)
   02/01/20 10:16 AM   Grace Hanson 07-31-1991 [id number]  Referring provider: Rica Records, PA-C [address] [place],  [place] [address] Chief Complaint  Patient presents with  . Hematuria    HPI: Grace Hanson is a 29 y.o. F who presents today for the evaluation and management of gross hematuria.   Her UA on dip from 11/09/19 revealed large blood w/ negative culture.   Most recent upper tract imaging in the form of CT AP w/ contrast from 07/06/2019 revealed no acute findings within the abdomen or pelvis.   She saw her OB/GYN 1 month ago where she reported of suprapubic pain, burning and blood when she wipes. She experiences intermittent burning in the urethra area but not when she urinates specifically. She also does not have pain w/ urination. She had a pelvic exam and pap smear done which was unremarkable. She is not sure of what is causing her pain. She is not able to hold her urine and denies having accidents. She has not had children, is sexually active, and denies pain w/ intercourse. Denies constipation. No vaginal or urethral issues.   She is currently not on her period.   Denies STI infections including Gonerrhea and chlamydia.   PMH: Past Medical History:  Diagnosis Date  . Asthma     Surgical History: No past surgical history on file.  Home Medications:  Allergies as of 02/01/2020   No Known Allergies     Medication List    as of Feb 01, 2020 11:59 PM   You have not been prescribed any medications.     Allergies: No Known Allergies  Family History: Family History  Problem Relation Age of Onset  . Diabetes Mother   . Diabetes Sister     Social History:  reports that she has quit smoking. She smoked 0.50 packs per day. She has never used smokeless tobacco. She reports that she does not drink alcohol or use drugs.   Physical Exam: BP 115/82   Pulse 85   Ht 5\' 3"  (1.6 m)   Wt 185 lb (83.9 kg)   BMI 32.77 kg/m   Constitutional:   Alert and oriented, No acute distress. HEENT: [place] AT, moist mucus membranes.  Trachea midline, no masses. Cardiovascular: No clubbing, cyanosis, or edema. Respiratory: Normal respiratory effort, no increased work of breathing. Skin: No rashes, bruises or suspicious lesions. Neurologic: Grossly intact, no focal deficits, moving all 4 extremities. Psychiatric: Normal mood and affect.  Laboratory Data:  Urinalysis Microscopic blood 3-10 RBC/hpf w/ many bacteria.   Assessment & Plan:    1. Gross/ Microscopic hematuria   3-10 RBC/hpf w/ many bacteria today with previous episode of gross hematuria Recommended cysto/pelvic at next available for further evaluation   [facility] [address] [place], [place] [phone]  I, [phone], am acting as a scribe for Dr. Donne Hazel,  I have reviewed the above documentation for accuracy and completeness, and I agree with the above.   Vanna Scotland, MD

## 2020-02-01 ENCOUNTER — Ambulatory Visit (INDEPENDENT_AMBULATORY_CARE_PROVIDER_SITE_OTHER): Payer: Self-pay | Admitting: Urology

## 2020-02-01 ENCOUNTER — Other Ambulatory Visit: Payer: Self-pay

## 2020-02-01 ENCOUNTER — Encounter: Payer: Self-pay | Admitting: Urology

## 2020-02-01 VITALS — BP 115/82 | HR 85 | Ht 63.0 in | Wt 185.0 lb

## 2020-02-01 DIAGNOSIS — R310 Gross hematuria: Secondary | ICD-10-CM

## 2020-02-01 DIAGNOSIS — R3129 Other microscopic hematuria: Secondary | ICD-10-CM

## 2020-02-01 NOTE — Patient Instructions (Signed)

## 2020-02-02 ENCOUNTER — Ambulatory Visit (INDEPENDENT_AMBULATORY_CARE_PROVIDER_SITE_OTHER): Payer: Self-pay | Admitting: Urology

## 2020-02-02 ENCOUNTER — Encounter: Payer: Self-pay | Admitting: Urology

## 2020-02-02 VITALS — BP 150/93 | HR 81

## 2020-02-02 DIAGNOSIS — N369 Urethral disorder, unspecified: Secondary | ICD-10-CM

## 2020-02-02 DIAGNOSIS — R3129 Other microscopic hematuria: Secondary | ICD-10-CM

## 2020-02-02 LAB — MICROSCOPIC EXAMINATION

## 2020-02-02 LAB — URINALYSIS, COMPLETE
Bilirubin, UA: NEGATIVE
Glucose, UA: NEGATIVE
Ketones, UA: NEGATIVE
Leukocytes,UA: NEGATIVE
Nitrite, UA: NEGATIVE
Protein,UA: NEGATIVE
Specific Gravity, UA: >1.03 — ABNORMAL HIGH (ref 1.005–1.030)
Urobilinogen, Ur: 0.2 mg/dL (ref 0.2–1.0)
pH, UA: 5 (ref 5.0–7.5)

## 2020-02-02 MED ORDER — CIPROFLOXACIN HCL 500 MG PO TABS
500.0000 mg | ORAL_TABLET | Freq: Once | ORAL | Status: AC
  Administered 2020-02-02: 500 mg via ORAL

## 2020-02-02 NOTE — Progress Notes (Signed)
   02/02/20   CC:  Chief Complaint  Patient presents with  . Cysto   HPI: Grace Hanson is a 29 y.o. F who returns today for a cysto for the evaluation and management of gross hematuria.   Her UA on dip from 11/09/19 revealed large blood w/ negative culture.   Most recent upper tract imaging in the form of CT AP w/ contrast from 07/06/2019 revealed no acute findings within the abdomen or pelvis.   Her UA from 02/01/20 revealed 3-10 RBC/hpf w/ many bacteria   She is currently not on her period.   Denies STI infections including Gonerrhea and chlamydia.   Please see previous note from yesterday for details.   Today's Vitals   02/02/20 1555  BP: (!) 150/93  Pulse: 81   There is no height or weight on file to calculate BMI. NED. A&Ox3.   No respiratory distress   Abd soft, NT, ND Normal external genitalia with patent urethral meatus  Cystoscopy/Pelvic Exam Procedure Note  Patient identification was confirmed, informed consent was obtained, and patient was prepped using Betadine solution.  Lidocaine jelly was administered per urethral meatus.    Procedure: - Flexible cystoscope introduced, without any difficulty.   - Thorough search of the bladder revealed:    Mid-urethra had small amount of blood at 3 o'clock position w/ mass effect at proximal anterior aspect (?extrinsic compression)    normal urothelium    no stones    no ulcers     no tumors    no urethral polyps    no trabeculation  - Ureteral orifices were normal in position and appearance.  Pelvic Exam: Urethral meatus obscured by redundant tissue w/ some mass effect over urethra at 6 o' clock w/ no overt mass identified. Unable to express anything w/ compression. Vaginal wall unremarkable. Normal external genitalia.   Post-Procedure: - Patient tolerated the procedure well  Assessment/ Plan:  1. Urethral lesion/bleeding Possible mass effect of urethra  DDx includes urethral cyst vs diverticulum  Will  order Pelvic MRI and f/u w/ Dr. Sherron Monday for second opinion    I, Grace Hanson, am acting as a scribe for Dr. Vanna Hanson,  I have reviewed the above documentation for accuracy and completeness, and I agree with the above.   Grace Scotland, MD

## 2020-02-19 ENCOUNTER — Other Ambulatory Visit: Payer: Self-pay

## 2020-02-19 ENCOUNTER — Ambulatory Visit
Admission: RE | Admit: 2020-02-19 | Discharge: 2020-02-19 | Disposition: A | Discharge status: Home / Self Care | Payer: PRIVATE HEALTH INSURANCE | Source: Ambulatory Visit | Attending: Urology | Admitting: Urology

## 2020-02-19 DIAGNOSIS — N369 Urethral disorder, unspecified: Secondary | ICD-10-CM | POA: Diagnosis not present

## 2020-02-19 MED ORDER — GADOBUTROL 1 MMOL/ML IV SOLN
8.0000 mL | Freq: Once | INTRAVENOUS | Status: AC | PRN
  Administered 2020-02-19: 8 mL via INTRAVENOUS

## 2020-02-24 ENCOUNTER — Telehealth: Payer: Self-pay

## 2020-02-24 NOTE — Telephone Encounter (Signed)
-----   Message from Vanna Scotland, MD sent at 02/23/2020 12:46 PM EDT ----- MRI looks fine which is great news.  I still would like her to see Dr. Sherron Monday for his opinion based on blood noted in urethra with cysto and irregular exam.  Vanna Scotland, MD

## 2020-02-24 NOTE — Telephone Encounter (Signed)
Patient aware of results will keep appointment with Dr. Sherron Monday

## 2020-03-06 ENCOUNTER — Other Ambulatory Visit: Payer: Self-pay

## 2020-03-06 ENCOUNTER — Ambulatory Visit (INDEPENDENT_AMBULATORY_CARE_PROVIDER_SITE_OTHER): Payer: Self-pay | Admitting: Urology

## 2020-03-06 ENCOUNTER — Encounter: Payer: Self-pay | Admitting: Urology

## 2020-03-06 VITALS — BP 127/87 | HR 96

## 2020-03-06 DIAGNOSIS — R350 Frequency of micturition: Secondary | ICD-10-CM

## 2020-03-06 NOTE — Progress Notes (Signed)
   03/06/2020 3:45 PM   Grace Hanson 12-Apr-1991 [id number]  Referring provider: No referring provider defined for this encounter.  Chief Complaint  Patient presents with  . Follow-up    HPI Dr Apolinar Junes: Patient had recent abnormal cystoscopy with urethral mass noted.  She had an MRI which demonstrated normal urethra  TOday She was cleared for blood in the urine.  She says she can see a little bit of blood off and on when she wipes.  She has vague lower abdominal discomfort that comes and goes.  No vaginal pain. Frequency is stable Abdominal discomfort vague and intermittent  Physical examination demonstrated some redundant tissue at around 6:00 but within normal limits.  No change in color.  No tenderness.  No obvious skene gland cyst.          PMH: Past Medical History:  Diagnosis Date  . Asthma     Surgical History: No past surgical history on file.  Home Medications:  Allergies as of 03/06/2020   No Known Allergies     Medication List    as of March 06, 2020  3:45 PM   You have not been prescribed any medications.     Allergies: No Known Allergies  Family History: Family History  Problem Relation Age of Onset  . Diabetes Mother   . Diabetes Sister     Social History:  reports that she has quit smoking. She smoked 0.50 packs per day. She has never used smokeless tobacco. She reports that she does not drink alcohol or use drugs.  ROS:                                        Physical Exam: BP 127/87   Pulse 96    Laboratory Data: Lab Results  Component Value Date   WBC 16.2 (H) 07/06/2019   HGB 14.5 07/06/2019   HCT 42.3 07/06/2019   MCV 83.4 07/06/2019   PLT 565 (H) 07/06/2019    Lab Results  Component Value Date   CREATININE 0.51 07/06/2019    No results found for: PSA  No results found for: TESTOSTERONE  No results found for: HGBA1C  Urinalysis    Component Value Date/Time   COLORURINE YELLOW  (A) 07/06/2019 1827   APPEARANCEUR Cloudy (A) 02/01/2020 1447   LABSPEC 1.025 07/06/2019 1827   PHURINE 5.0 07/06/2019 1827   GLUCOSEU Negative 02/01/2020 1447   HGBUR MODERATE (A) 07/06/2019 1827   BILIRUBINUR Negative 02/01/2020 1447   KETONESUR NEGATIVE 07/06/2019 1827   PROTEINUR Negative 02/01/2020 1447   PROTEINUR NEGATIVE 07/06/2019 1827   NITRITE Negative 02/01/2020 1447   NITRITE NEGATIVE 07/06/2019 1827   LEUKOCYTESUR Negative 02/01/2020 1447   LEUKOCYTESUR TRACE (A) 07/06/2019 1827    Pertinent Imaging:   Assessment & Plan: The patient has been cleared for hematuria.  I did not recystoscoped her.  She did not have a lesion within the urethra and based upon her age and risk factors this would be exceptionally rare.  MRI was normal.  Reassurance given.  There are no diagnoses linked to this encounter.  No follow-ups on file.  Martina Sinner, MD  [facility] [address], [address] [place], [place] [address] [phone]

## 2021-01-02 IMAGING — MR MR PELVIS WO/W CM
7 of 10 series · 27 of 48 positions shown · IV contrast (gadavist)
Comparison: None.

CLINICAL DATA: Gross hematuria, mass effect on urethra, evaluate
for urethral cyst versus diverticulum

EXAM:
MRI PELVIS WITHOUT AND WITH CONTRAST
TECHNIQUE: Multiplanar multisequence MR imaging of the pelvis was performed
both before and after administration of intravenous contrast.
CONTRAST:  8mL GADAVIST GADOBUTROL 1 MMOL/ML IV SOLN

[Series 3: T2 fat-sat · coronal · 4.0mm · 0.69mm/px · 4 of 31 slices shown]
[im 1/31]
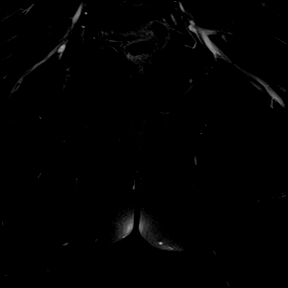
[im 11/31]
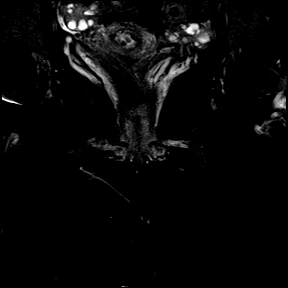
[im 21/31]
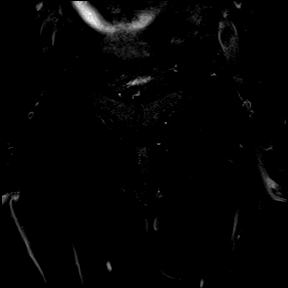
[im 31/31]
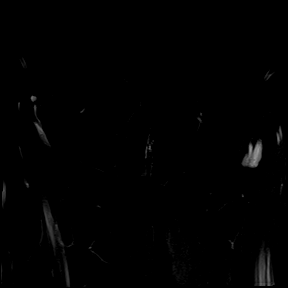

[Series 7: T2 · axial · 4.0mm · 0.47mm/px · z∈[-80,+56]mm · 4 of 35 slices shown (1 of 4)]
[im 1/35]
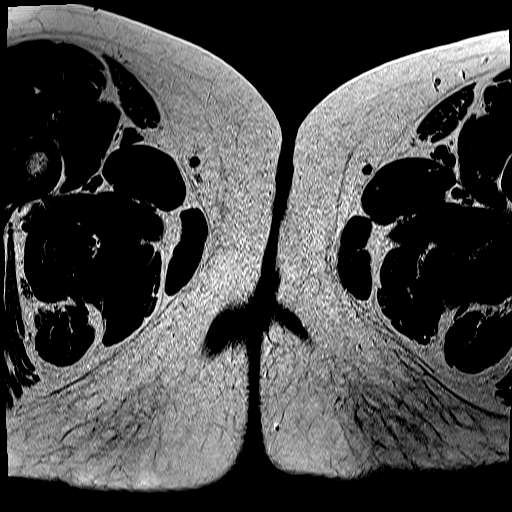
[im 12/35]
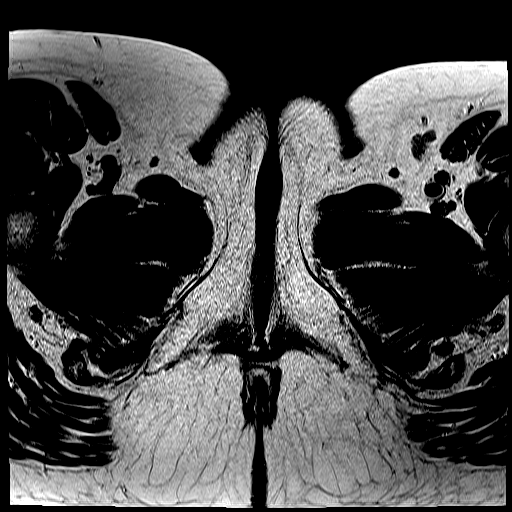
[im 23/35]
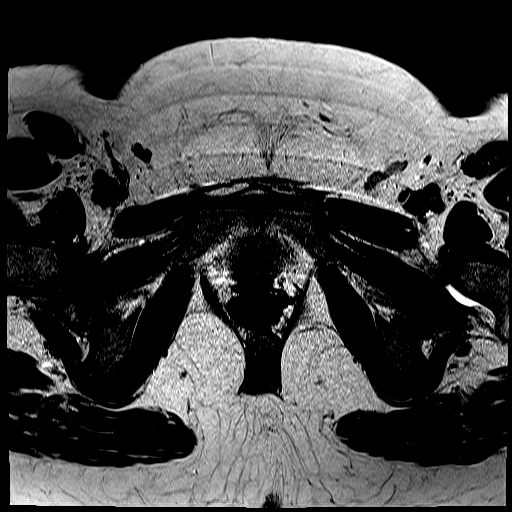
[im 35/35]
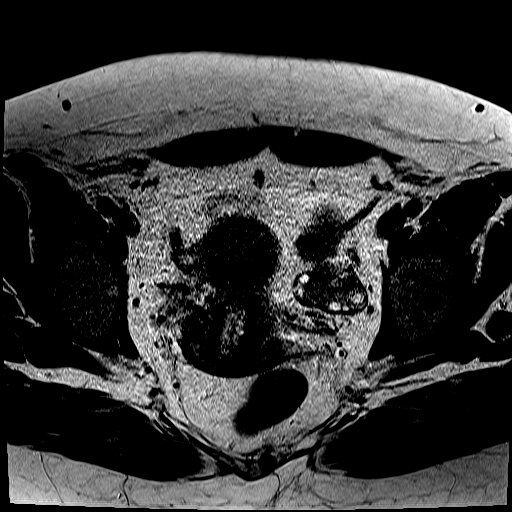

[Series 9: T2 · axial · 4.0mm · 0.47mm/px · z∈[-80,+56]mm · 4 of 35 slices shown (2 of 4)]
[im 1/35]
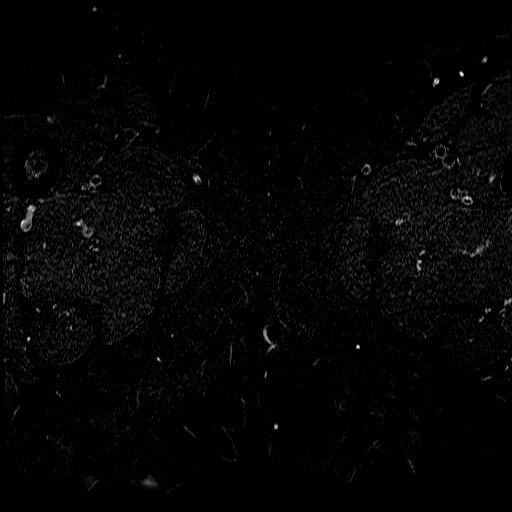
[im 12/35]
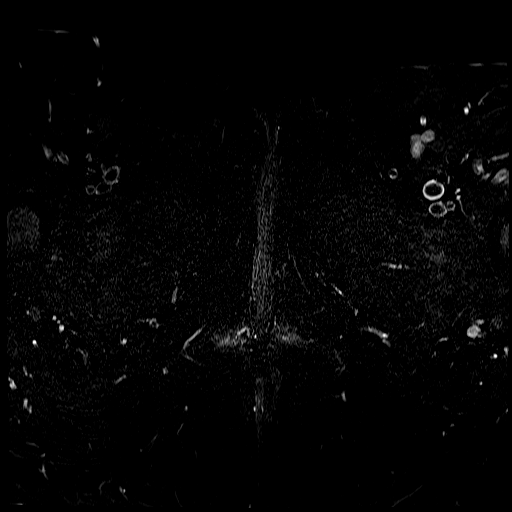
[im 23/35]
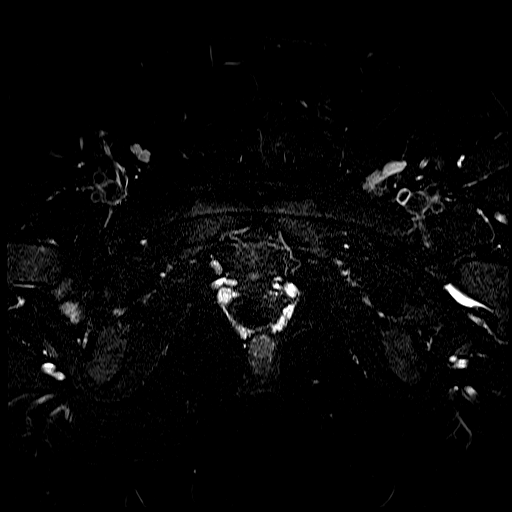
[im 35/35]
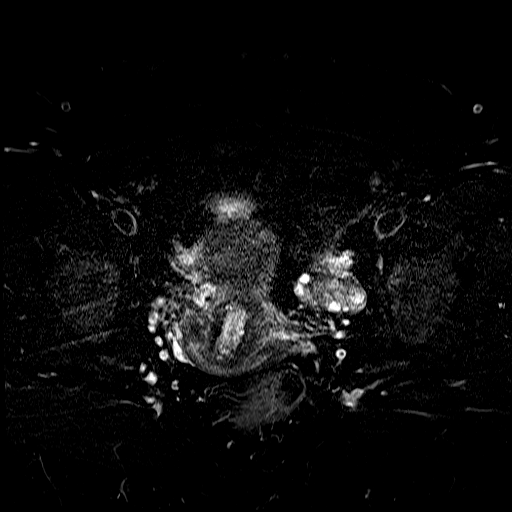

[Series 10: T2 · sagittal · 4.0mm · 0.49mm/px · 3 of 25 slices shown (3 of 4)]
[im 1/25]
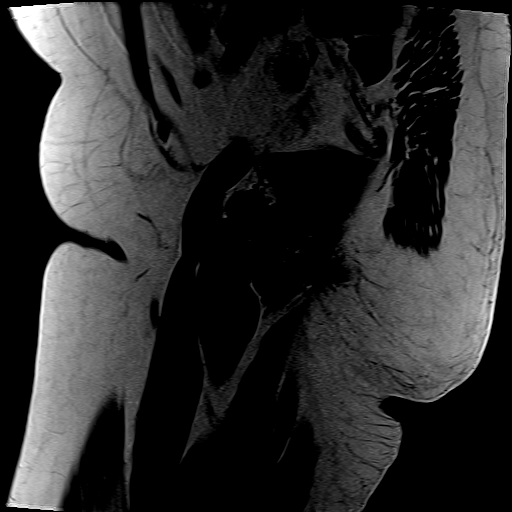
[im 13/25]
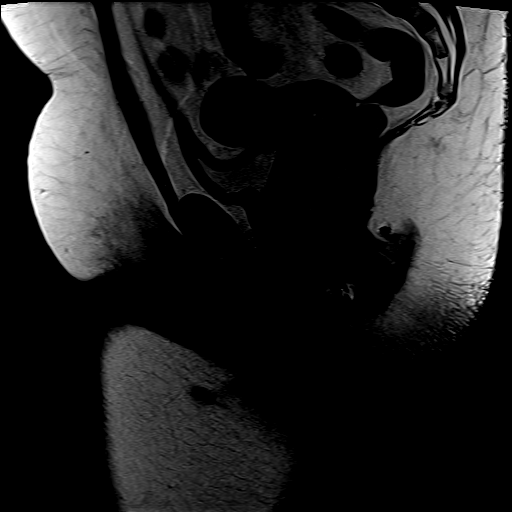
[im 25/25]
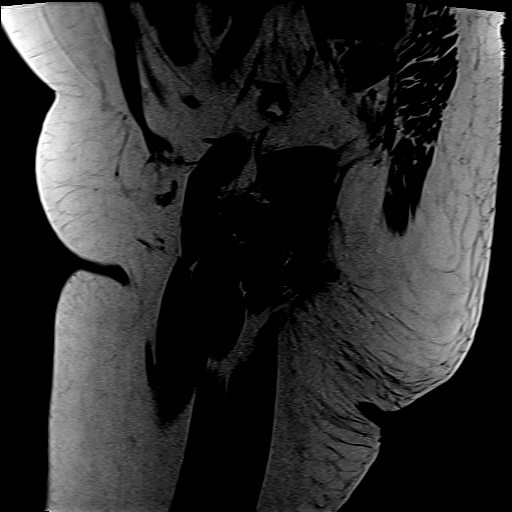

[Series 12: T2 · sagittal · 4.0mm · 0.49mm/px · 3 of 25 slices shown (4 of 4)]
[im 1/25]
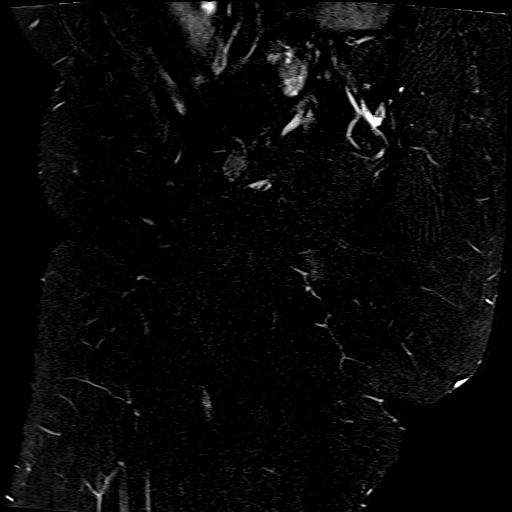
[im 13/25]
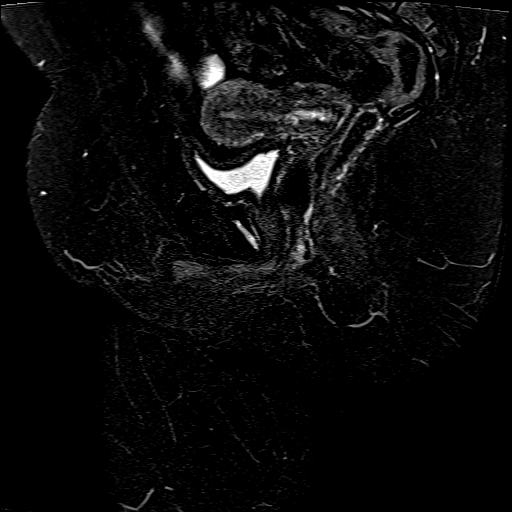
[im 25/25]
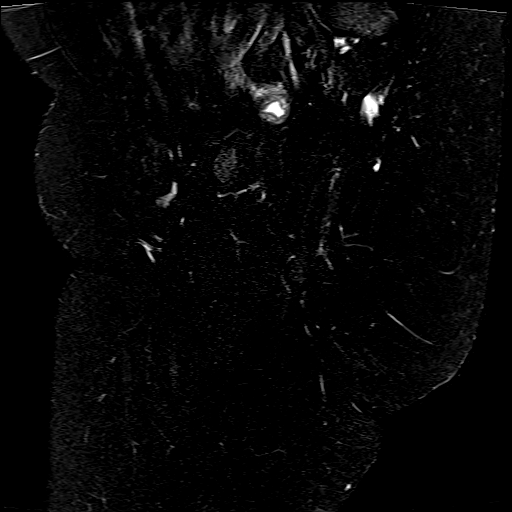

[Series 13: T1 dynamic · axial · 4.0mm · 0.75mm/px · z∈[-107,+97]mm · 6 of 52 slices shown (1 of 2)]
[im 1/52]
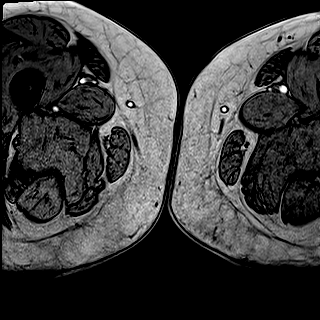
[im 11/52]
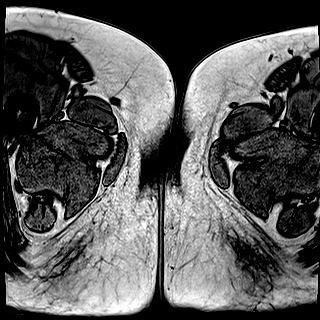
[im 21/52]
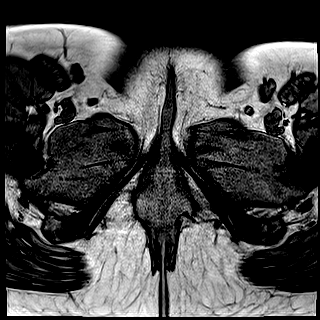
[im 31/52]
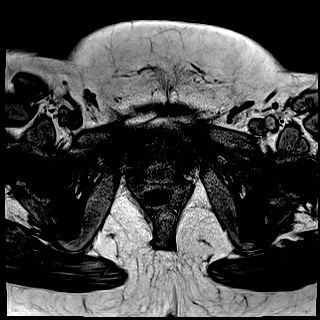
[im 41/52]
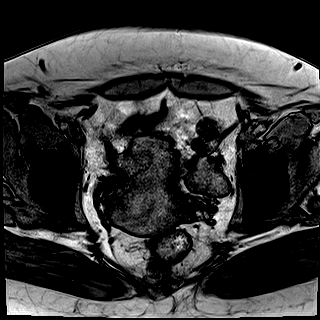
[im 52/52]
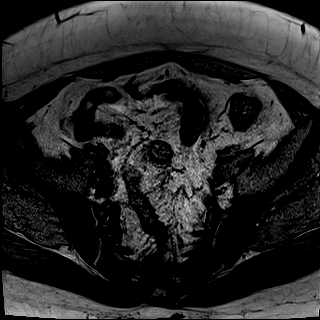

[Series 14: T1 dynamic · axial · 4.0mm · 0.75mm/px · z∈[-107,-27]mm · 3 of 52 slices shown (2 of 2)]
[im 1/52]
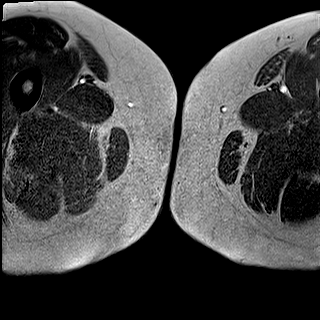
[im 11/52]
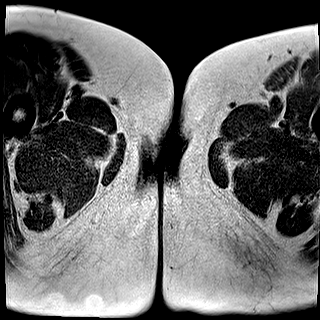
[im 21/52]
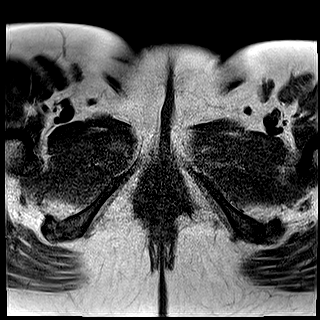

[27 of 48 positions shown; findings below may reference images not displayed]

FINDINGS: Urinary Tract: Bladder is within normal limits. Prominent urachal
ligament is noted along the bladder dome (series 11/image 13).

Urethra is unremarkable.  No urethral cyst/diverticulum.

Bowel:  Visualized bowel is unremarkable.

Vascular/Lymphatic: No evidence of aneurysm.

No suspicious pelvic lymphadenopathy.

Reproductive:  Uterus is within normal limits.

No lower vaginal wall cyst to account for mass effect on the
urethra.

Bilateral ovaries are within normal limits.

Other:  No pelvic ascites.

Musculoskeletal: No focal osseous lesions.
IMPRESSION: Urethra is within normal limits. No urethral cyst or diverticulum.
No lower vaginal wall cyst to account for mass effect on the
urethra.

Incidental note is made of a prominent urachal ligament along the
bladder dome.

## 2024-04-16 ENCOUNTER — Ambulatory Visit (LOCAL_COMMUNITY_HEALTH_CENTER): Payer: Self-pay

## 2024-04-16 DIAGNOSIS — Z111 Encounter for screening for respiratory tuberculosis: Secondary | ICD-10-CM

## 2024-04-19 ENCOUNTER — Ambulatory Visit (LOCAL_COMMUNITY_HEALTH_CENTER): Payer: Self-pay

## 2024-04-19 DIAGNOSIS — Z111 Encounter for screening for respiratory tuberculosis: Secondary | ICD-10-CM

## 2024-04-19 LAB — TB SKIN TEST
Induration: 0 mm
TB Skin Test: NEGATIVE

## 2024-04-23 ENCOUNTER — Ambulatory Visit (LOCAL_COMMUNITY_HEALTH_CENTER): Payer: Self-pay

## 2024-04-23 DIAGNOSIS — Z111 Encounter for screening for respiratory tuberculosis: Secondary | ICD-10-CM

## 2024-04-26 ENCOUNTER — Ambulatory Visit (LOCAL_COMMUNITY_HEALTH_CENTER): Payer: Self-pay

## 2024-04-26 DIAGNOSIS — Z111 Encounter for screening for respiratory tuberculosis: Secondary | ICD-10-CM

## 2024-04-26 LAB — TB SKIN TEST
Induration: 0 mm
TB Skin Test: NEGATIVE
